# Patient Record
Sex: Female | Born: 1965 | Race: White | Hispanic: No | Marital: Married | Smoking: Never smoker
Health system: Southern US, Community
[De-identification: ages and names within clinical notes are randomized; demographics above are authoritative.]

## PROBLEM LIST (undated history)

## (undated) HISTORY — PX: CHOLECYSTECTOMY: SHX55

---

## 2014-06-21 ENCOUNTER — Encounter: Payer: Self-pay | Admitting: *Deleted

## 2014-06-21 ENCOUNTER — Emergency Department (INDEPENDENT_AMBULATORY_CARE_PROVIDER_SITE_OTHER)
Admission: EM | Admit: 2014-06-21 | Discharge: 2014-06-21 | Disposition: A | Discharge status: Home / Self Care | Payer: Self-pay | Source: Home / Self Care | Attending: Family Medicine | Admitting: Family Medicine

## 2014-06-21 ENCOUNTER — Emergency Department (INDEPENDENT_AMBULATORY_CARE_PROVIDER_SITE_OTHER): Payer: Worker's Compensation

## 2014-06-21 DIAGNOSIS — M25572 Pain in left ankle and joints of left foot: Secondary | ICD-10-CM

## 2014-06-21 DIAGNOSIS — S93402A Sprain of unspecified ligament of left ankle, initial encounter: Secondary | ICD-10-CM | POA: Diagnosis not present

## 2014-06-21 DIAGNOSIS — S99912A Unspecified injury of left ankle, initial encounter: Secondary | ICD-10-CM | POA: Diagnosis not present

## 2014-06-21 DIAGNOSIS — S0990XA Unspecified injury of head, initial encounter: Secondary | ICD-10-CM

## 2014-06-21 NOTE — ED Provider Notes (Signed)
CSN: [id number]     Arrival date & time 06/21/14  1124 History   First MD Initiated Contact with Patient 06/21/14 1157     Chief Complaint  Patient presents with  . Fall      HPI Comments: Approximately one hour ago while walking down stairs at work, patient suddenly and briefly lost consciousness and fell near the bottom of the stairs.  She landed in the stairwell, and her posterior head hit a side wall.  She gained consciousness immediately.  Since then she has had nausea without vomiting and a mild posterior headache, but no other neurologic symptoms.  She twisted her left ankle and has pain with weight bearing. She states that she has a past history of anemia, but no recent episodes of dizziness or light-headedness.  Patient is a 49 y.o. female presenting with ankle pain. The history is provided by the patient.  Ankle Pain Location:  Ankle Time since incident:  1 hour Injury: yes   Mechanism of injury: fall   Fall:    Fall occurred:  Down stairs   Impact surface:  Hard floor   Entrapped after fall: no   Ankle location:  L ankle Pain details:    Quality:  Aching   Radiates to:  Does not radiate   Severity:  Moderate   Onset quality:  Sudden   Duration:  1 hour   Timing:  Constant   Progression:  Unchanged Chronicity:  New Dislocation: no   Prior injury to area:  No Relieved by:  None tried Worsened by:  Bearing weight Ineffective treatments:  None tried Associated symptoms: decreased ROM, stiffness and swelling   Associated symptoms: no neck pain, no numbness and no tingling   Risk factors: obesity     History reviewed. No pertinent past medical history. History reviewed. No pertinent past surgical history. Family History  Problem Relation Age of Onset  . Alzheimer's disease Mother   . Cancer Father     lung  . Diabetes Father    History  Substance Use Topics  . Smoking status: Never Smoker   . Smokeless tobacco: Not on file  . Alcohol Use: No   OB History      No data available     Review of Systems  Gastrointestinal: Positive for nausea. Negative for vomiting.  Musculoskeletal: Positive for stiffness. Negative for neck pain.       Left ankle pain  Neurological: Positive for headaches. Negative for dizziness, speech difficulty, weakness, light-headedness and numbness.  All other systems reviewed and are negative.   Allergies  Review of patient's allergies indicates no known allergies.  Home Medications   Prior to Admission medications   Not on File   BP 123/87 mmHg  Pulse 72  Temp(Src) 98.1 F (36.7 C) (Oral)  Resp 16  Ht 5\' 5"  (1.651 m)  Wt 260 lb (117.935 kg)  BMI 43.27 kg/m2  SpO2 98%  LMP 06/21/2014 Physical Exam  Constitutional: She is oriented to person, place, and time. She appears well-developed and well-nourished. No distress.  Patient is obese (BMI 43.3)  HENT:  Head: Normocephalic and atraumatic.  Right Ear: External ear normal.  Left Ear: External ear normal.  Nose: Nose normal.  Mouth/Throat: Oropharynx is clear and moist.  Eyes: Conjunctivae and EOM are normal. Pupils are equal, round, and reactive to light.  Neck: Normal range of motion.  Cardiovascular: Normal heart sounds.   Pulmonary/Chest: Breath sounds normal.  Abdominal: There is no tenderness.  Musculoskeletal:  Left ankle: She exhibits decreased range of motion and swelling. She exhibits no ecchymosis, no deformity, no laceration and normal pulse. Tenderness. Lateral malleolus tenderness found. No medial malleolus, no AITFL, no CF ligament, no posterior TFL, no head of 5th metatarsal and no proximal fibula tenderness found. Achilles tendon normal.       Feet:  Neurological: She is alert and oriented to person, place, and time. She has normal reflexes. No cranial nerve deficit. She exhibits normal muscle tone. Coordination normal.  Skin: Skin is warm and dry.  Nursing note and vitals reviewed.   ED Course  Procedures  none   Lab  Review: POCT CBC:  WBC 7.3; LY 31.4; MO 4.0; GR 64.6; Hgb 13.9; Platelets 173   Imaging Review Dg Ankle Complete Left  06/21/2014   CLINICAL DATA:  Larey Seat down steps today. Lateral ankle pain, swelling.  EXAM: LEFT ANKLE COMPLETE - 3+ VIEW  COMPARISON:  None.  FINDINGS: Lateral soft tissue swelling. No underlying bony abnormality. No fracture, subluxation or dislocation. Joint spaces are maintained. Plantar calcaneal spur present.  IMPRESSION: No acute bony abnormality.   Electronically Signed   By: Charlett Nose M.D.   On: 06/21/2014 12:45     MDM   1. Head injury, initial encounter, secondary to brief loss of cosciousness and fall.  No evidence anemia today   2. Left ankle injury, initial encounter   3. Left ankle sprain, initial encounter    Dispensed crutches.  Ace wrap applied, and fitted with AirCast Stirrup splint. Discussed head injury precautions. Apply ice pack for 30 minutes every 1 to 2 hours today and tomorrow.  Elevate.  Use crutches for 3 to 5 days.  Wear Ace wrap until swelling decreases.  Wear brace for about 2 to 3 weeks.  Begin range of motion and stretching exercises in about 5 days as per instruction sheet. May take Ibuprofen , 4 tabs every 8 hours with food.  If increasing nausea, vomiting, headache, etc occur, proceed to the local emergency room.  Followup with [facility] in one week. Recommend that she follow-up with her PCP if she develops recurrent episodes of dizziness or loss of consciousness.  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  Must use crutches for one week.    Lattie Haw, MD 06/21/14 [phone]

## 2014-06-21 NOTE — ED Notes (Signed)
Lauren Blankenship reports that she was walking down the stairs at work when "everything went black for a split second" and she fell injury her LT ankle and hitting her head. Denies any blurry vision now. She does c/o some nausea.

## 2014-06-21 NOTE — Discharge Instructions (Signed)
Apply ice pack for 30 minutes every 1 to 2 hours today and tomorrow.  Elevate.  Use crutches for 3 to 5 days.  Wear Ace wrap until swelling decreases.  Wear brace for about 2 to 3 weeks.  Begin range of motion and stretching exercises in about 5 days as per instruction sheet. May take Ibuprofen , 4 tabs every 8 hours with food.  If increasing nausea, vomiting, headache, etc occur, proceed to the local emergency room.    Ankle Sprain An ankle sprain is an injury to the strong, fibrous tissues (ligaments) that hold the bones of your ankle joint together.  CAUSES An ankle sprain is usually caused by a fall or by twisting your ankle. Ankle sprains most commonly occur when you step on the outer edge of your foot, and your ankle turns inward. People who participate in sports are more prone to these types of injuries.  SYMPTOMS   Pain in your ankle. The pain may be present at rest or only when you are trying to stand or walk.  Swelling.  Bruising. Bruising may develop immediately or within 1 to 2 days after your injury.  Difficulty standing or walking, particularly when turning corners or changing directions. DIAGNOSIS  Your caregiver will ask you details about your injury and perform a physical exam of your ankle to determine if you have an ankle sprain. During the physical exam, your caregiver will press on and apply pressure to specific areas of your foot and ankle. Your caregiver will try to move your ankle in certain ways. An X-ray exam may be done to be sure a bone was not broken or a ligament did not separate from one of the bones in your ankle (avulsion fracture).  TREATMENT  Certain types of braces can help stabilize your ankle. Your caregiver can make a recommendation for this. Your caregiver may recommend the use of medicine for pain. If your sprain is severe, your caregiver may refer you to a surgeon who helps to restore function to parts of your skeletal system (orthopedist) or a  physical therapist. HOME CARE INSTRUCTIONS   Apply ice to your injury for 1-2 days or as directed by your caregiver. Applying ice helps to reduce inflammation and pain.  Put ice in a plastic bag.  Place a towel between your skin and the bag.  Leave the ice on for 15-20 minutes at a time, every 2 hours while you are awake.  Only take over-the-counter or prescription medicines for pain, discomfort, or fever as directed by your caregiver.  Elevate your injured ankle above the level of your heart as much as possible for 2-3 days.  If your caregiver recommends crutches, use them as instructed. Gradually put weight on the affected ankle. Continue to use crutches or a cane until you can walk without feeling pain in your ankle.  If you have a plaster splint, wear the splint as directed by your caregiver. Do not rest it on anything harder than a pillow for the first 24 hours. Do not put weight on it. Do not get it wet. You may take it off to take a shower or bath.  You may have been given an elastic bandage to wear around your ankle to provide support. If the elastic bandage is too tight (you have numbness or tingling in your foot or your foot becomes cold and blue), adjust the bandage to make it comfortable.  If you have an air splint, you may blow more air into  it or let air out to make it more comfortable. You may take your splint off at night and before taking a shower or bath. Wiggle your toes in the splint several times per day to decrease swelling. SEEK MEDICAL CARE IF:   You have rapidly increasing bruising or swelling.  Your toes feel extremely cold or you lose feeling in your foot.  Your pain is not relieved with medicine. SEEK IMMEDIATE MEDICAL CARE IF:  Your toes are numb or blue.  You have severe pain that is increasing. MAKE SURE YOU:   Understand these instructions.  Will watch your condition.  Will get help right away if you are not doing well or get worse. Document  Released: 05/26/2005 Document Revised: 02/18/2012 Document Reviewed: 06/07/2011 [facility] Patient Information 2015 [place], [place]. This information is not intended to replace advice given to you by your health care provider. Make sure you discuss any questions you have with your health care provider.   Head Injury You have received a head injury. It does not appear serious at this time. Headaches and vomiting are common following head injury. It should be easy to awaken from sleeping. Sometimes it is necessary for you to stay in the emergency department for a while for observation. Sometimes admission to the hospital may be needed. After injuries such as yours, most problems occur within the first 24 hours, but side effects may occur up to 7-10 days after the injury. It is important for you to carefully monitor your condition and contact your health care provider or seek immediate medical care if there is a change in your condition. WHAT ARE THE TYPES OF HEAD INJURIES? Head injuries can be as minor as a bump. Some head injuries can be more severe. More severe head injuries include:  A jarring injury to the brain (concussion).  A bruise of the brain (contusion). This mean there is bleeding in the brain that can cause swelling.  A cracked skull (skull fracture).  Bleeding in the brain that collects, clots, and forms a bump (hematoma). WHAT CAUSES A HEAD INJURY? A serious head injury is most likely to happen to someone who is in a car wreck and is not wearing a seat belt. Other causes of major head injuries include bicycle or motorcycle accidents, sports injuries, and falls. HOW ARE HEAD INJURIES DIAGNOSED? A complete history of the event leading to the injury and your current symptoms will be helpful in diagnosing head injuries. Many times, pictures of the brain, such as CT or MRI are needed to see the extent of the injury. Often, an overnight hospital stay is necessary for observation.  WHEN  SHOULD I SEEK IMMEDIATE MEDICAL CARE?  You should get help right away if:  You have confusion or drowsiness.  You feel sick to your stomach (nauseous) or have continued, forceful vomiting.  You have dizziness or unsteadiness that is getting worse.  You have severe, continued headaches not relieved by medicine. Only take over-the-counter or prescription medicines for pain, fever, or discomfort as directed by your health care provider.  You do not have normal function of the arms or legs or are unable to walk.  You notice changes in the black spots in the center of the colored part of your eye (pupil).  You have a clear or bloody fluid coming from your nose or ears.  You have a loss of vision. During the next 24 hours after the injury, you must stay with someone who can watch you for  the warning signs. This person should contact local emergency services (911 in the [place] if you have seizures, you become unconscious, or you are unable to wake up. HOW CAN I PREVENT A HEAD INJURY IN THE FUTURE? The most important factor for preventing major head injuries is avoiding motor vehicle accidents. To minimize the potential for damage to your head, it is crucial to wear seat belts while riding in motor vehicles. Wearing helmets while bike riding and playing collision sports (like football) is also helpful. Also, avoiding dangerous activities around the house will further help reduce your risk of head injury.  WHEN CAN I RETURN TO NORMAL ACTIVITIES AND ATHLETICS? You should be reevaluated by your health care provider before returning to these activities. If you have any of the following symptoms, you should not return to activities or contact sports until 1 week after the symptoms have stopped:  Persistent headache.  Dizziness or vertigo.  Poor attention and concentration.  Confusion.  Memory problems.  Nausea or vomiting.  Fatigue or tire easily.  Irritability.  Intolerant of bright  lights or loud noises.  Anxiety or depression.  Disturbed sleep. MAKE SURE YOU:   Understand these instructions.  Will watch your condition.  Will get help right away if you are not doing well or get worse. Document Released: 05/26/2005 Document Revised: 05/31/2013 Document Reviewed: 01/31/2013 [facility] Patient Information 2015 [place]. This information is not intended to replace advice given to you by your health care provider. Make sure you discuss any questions you have with your health care provider.

## 2014-06-22 LAB — POCT CBC W AUTO DIFF (K'VILLE URGENT CARE)

## 2014-06-28 ENCOUNTER — Other Ambulatory Visit: Payer: Self-pay | Admitting: Adult Health

## 2014-06-28 ENCOUNTER — Ambulatory Visit (INDEPENDENT_AMBULATORY_CARE_PROVIDER_SITE_OTHER): Payer: Self-pay

## 2014-06-28 DIAGNOSIS — R52 Pain, unspecified: Secondary | ICD-10-CM

## 2014-06-28 DIAGNOSIS — M25571 Pain in right ankle and joints of right foot: Secondary | ICD-10-CM

## 2015-08-09 DIAGNOSIS — R42 Dizziness and giddiness: Secondary | ICD-10-CM | POA: Diagnosis not present

## 2015-08-09 DIAGNOSIS — F411 Generalized anxiety disorder: Secondary | ICD-10-CM | POA: Diagnosis not present

## 2015-10-25 DIAGNOSIS — B9689 Other specified bacterial agents as the cause of diseases classified elsewhere: Secondary | ICD-10-CM | POA: Diagnosis not present

## 2015-10-25 DIAGNOSIS — J208 Acute bronchitis due to other specified organisms: Secondary | ICD-10-CM | POA: Diagnosis not present

## 2015-10-25 DIAGNOSIS — R509 Fever, unspecified: Secondary | ICD-10-CM | POA: Diagnosis not present

## 2015-12-12 DIAGNOSIS — K8050 Calculus of bile duct without cholangitis or cholecystitis without obstruction: Secondary | ICD-10-CM | POA: Diagnosis not present

## 2015-12-12 DIAGNOSIS — K760 Fatty (change of) liver, not elsewhere classified: Secondary | ICD-10-CM | POA: Diagnosis not present

## 2015-12-12 DIAGNOSIS — R8299 Other abnormal findings in urine: Secondary | ICD-10-CM | POA: Diagnosis not present

## 2015-12-12 DIAGNOSIS — R161 Splenomegaly, not elsewhere classified: Secondary | ICD-10-CM | POA: Diagnosis not present

## 2015-12-12 DIAGNOSIS — R109 Unspecified abdominal pain: Secondary | ICD-10-CM | POA: Diagnosis not present

## 2015-12-12 DIAGNOSIS — R197 Diarrhea, unspecified: Secondary | ICD-10-CM | POA: Diagnosis not present

## 2016-02-28 ENCOUNTER — Encounter: Payer: Self-pay | Admitting: Emergency Medicine

## 2016-02-28 ENCOUNTER — Emergency Department (INDEPENDENT_AMBULATORY_CARE_PROVIDER_SITE_OTHER)
Admission: EM | Admit: 2016-02-28 | Discharge: 2016-02-28 | Disposition: A | Discharge status: Home / Self Care | Payer: 59 | Source: Home / Self Care | Attending: Family Medicine | Admitting: Family Medicine

## 2016-02-28 ENCOUNTER — Emergency Department (INDEPENDENT_AMBULATORY_CARE_PROVIDER_SITE_OTHER): Payer: 59

## 2016-02-28 DIAGNOSIS — M238X2 Other internal derangements of left knee: Secondary | ICD-10-CM

## 2016-02-28 DIAGNOSIS — M25562 Pain in left knee: Secondary | ICD-10-CM | POA: Diagnosis not present

## 2016-02-28 NOTE — Discharge Instructions (Signed)
Wear knee brace daytime.  Apply ice pack for 20 to 30 minutes, 3 to 4 times daily  Continue until pain decreases. May take Ibuprofen 200mg , 4 tabs every 8 hours with food.  Begin knee strengthening exercises.

## 2016-02-28 NOTE — ED Triage Notes (Signed)
Patient report left knee pain without known injury or motion for past 6 weeks; has intermittent pain and 'locking'. Took ibuprofen today at 0800.

## 2016-02-28 NOTE — ED Provider Notes (Signed)
[facility]    CSN: [id number] Arrival date & time: 02/28/16  1049  First Provider Contact:  First MD Initiated Contact with Patient 02/28/16 1118        History   Chief Complaint Chief Complaint  Patient presents with  . Knee Pain    HPI Lauren PancoastMichelle H Sween is a 50 y.o. female.   Patient complains of 6 week history of persistent pain in her left knee without known injury.  The knee frequently gives way, and she has difficulty extending the knee.   She has pain when walking/standing.     The history is provided by the patient.  Knee Pain  Location:  Knee Time since incident:  6 weeks Injury: no   Knee location:  L knee Pain details:    Quality:  Aching   Radiates to:  Does not radiate   Severity:  Moderate   Onset quality:  Gradual   Duration:  6 weeks   Timing:  Constant   Progression:  Unchanged Chronicity:  New Dislocation: no   Prior injury to area:  No Relieved by:  Nothing Worsened by:  Bearing weight, extension and activity Ineffective treatments:  Ice Associated symptoms: decreased ROM and stiffness   Associated symptoms: no back pain, no muscle weakness, no numbness, no swelling and no tingling   Risk factors: obesity     History reviewed. No pertinent past medical history.  There are no active problems to display for this patient.   Past Surgical History:  Procedure Laterality Date  . CHOLECYSTECTOMY      OB History    No data available       Home Medications    Prior to Admission medications   Not on File    Family History Family History  Problem Relation Age of Onset  . Cancer Father     lung  . Diabetes Father   . Alzheimer's disease Mother     Social History Social History  Substance Use Topics  . Smoking status: Never Smoker  . Smokeless tobacco: Never Used  . Alcohol use No     Allergies   Review of patient's allergies indicates no known allergies.   Review of Systems Review of Systems    Musculoskeletal: Positive for stiffness. Negative for back pain.  All other systems reviewed and are negative.    Physical Exam Triage Vital Signs ED Triage Vitals  Enc Vitals Group     BP 02/28/16 1116 132/79     Pulse Rate 02/28/16 1116 75     Resp 02/28/16 1116 16     Temp 02/28/16 1116 97.9 F (36.6 C)     Temp Source 02/28/16 1116 Oral     SpO2 02/28/16 1116 96 %     Weight 02/28/16 1117 260 lb (117.9 kg)     Height 02/28/16 1117 5\' 5"  (1.651 m)     Head Circumference --      Peak Flow --      Pain Score 02/28/16 1118 4     Pain Loc --      Pain Edu? --      Excl. in GC? --    No data found.   Updated Vital Signs BP 132/79 (BP Location: Left Arm)   Pulse 75   Temp 97.9 F (36.6 C) (Oral)   Resp 16   Ht 5\' 5"  (1.651 m)   Wt 260 lb (117.9 kg)   LMP 02/13/2016 (Exact Date)   SpO2 96%  BMI 43.27 kg/m   Visual Acuity Right Eye Distance:   Left Eye Distance:   Bilateral Distance:    Right Eye Near:   Left Eye Near:    Bilateral Near:     Physical Exam  Constitutional: She appears well-developed and well-nourished. No distress.  HENT:  Head: Atraumatic.  Nose: Nose normal.  Mouth/Throat: Oropharynx is clear and moist.  Eyes: Conjunctivae are normal. Pupils are equal, round, and reactive to light.  Neck: Normal range of motion.  Cardiovascular: Normal heart sounds.   Pulmonary/Chest: Breath sounds normal.  Musculoskeletal:       Left knee: She exhibits decreased range of motion. She exhibits no swelling, no ecchymosis, no deformity, no laceration, no erythema, normal alignment, no LCL laxity and normal patellar mobility. No tenderness found.   Left knee:  No effusion, erythema, or warmth.  Knee stable, negative drawer test.  McMurray test negative.  Patient has mild pain with full extension.  No distinct areas of tenderness.   Neurological: She is alert.  Skin: Skin is warm and dry. No rash noted.  Nursing note and vitals reviewed.    UC Treatments  / Results  Labs (all labs ordered are listed, but only abnormal results are displayed) Labs Reviewed - No data to display  EKG  EKG Interpretation None       Radiology Dg Knee Complete 4 Views Left  Result Date: 02/28/2016 CLINICAL DATA:  Left knee pain for 6 weeks. EXAM: LEFT KNEE - COMPLETE 4+ VIEW COMPARISON:  None. FINDINGS: No evidence of fracture, dislocation, or joint effusion. No evidence of arthropathy or other focal bone abnormality. Soft tissues are unremarkable. IMPRESSION: Negative. Electronically Signed   By: Marnee Spring M.D.   On: 02/28/2016 11:49    Procedures Procedures (including critical care time)  Medications Ordered in UC Medications - No data to display   Initial Impression / Assessment and Plan / UC Course  I have reviewed the triage vital signs and the nursing notes.  Pertinent labs & imaging results that were available during my care of the patient were reviewed by me and considered in my medical decision making (see chart for details).  Clinical Course  Dispensed hinged knee brace. Wear knee brace daytime.  Apply ice pack for 20 to 30 minutes, 3 to 4 times daily  Continue until pain decreases. May take Ibuprofen 200mg , 4 tabs every 8 hours with food.  Begin knee strengthening exercises. Followup with Dr. Rodney Langton or Dr. Clementeen Graham ([facility]) if not improving about two weeks.      Final Clinical Impressions(s) / UC Diagnoses   Final diagnoses:  Knee joint laxity, left    New Prescriptions New Prescriptions   No medications on file     Lattie Haw, MD 03/11/16 1126

## 2016-02-29 ENCOUNTER — Ambulatory Visit: Payer: Self-pay

## 2016-05-13 DIAGNOSIS — R8299 Other abnormal findings in urine: Secondary | ICD-10-CM | POA: Diagnosis not present

## 2016-05-13 DIAGNOSIS — N938 Other specified abnormal uterine and vaginal bleeding: Secondary | ICD-10-CM | POA: Diagnosis not present

## 2016-05-13 DIAGNOSIS — E782 Mixed hyperlipidemia: Secondary | ICD-10-CM | POA: Diagnosis not present

## 2016-05-13 DIAGNOSIS — Z0000 Encounter for general adult medical examination without abnormal findings: Secondary | ICD-10-CM | POA: Diagnosis not present

## 2016-05-13 DIAGNOSIS — E559 Vitamin D deficiency, unspecified: Secondary | ICD-10-CM | POA: Diagnosis not present

## 2016-05-24 DIAGNOSIS — E782 Mixed hyperlipidemia: Secondary | ICD-10-CM | POA: Diagnosis not present

## 2016-06-06 DIAGNOSIS — N938 Other specified abnormal uterine and vaginal bleeding: Secondary | ICD-10-CM | POA: Diagnosis not present

## 2016-06-18 DIAGNOSIS — Z1231 Encounter for screening mammogram for malignant neoplasm of breast: Secondary | ICD-10-CM | POA: Diagnosis not present

## 2016-06-30 DIAGNOSIS — N840 Polyp of corpus uteri: Secondary | ICD-10-CM | POA: Diagnosis not present

## 2016-06-30 DIAGNOSIS — N939 Abnormal uterine and vaginal bleeding, unspecified: Secondary | ICD-10-CM | POA: Diagnosis not present

## 2016-09-19 DIAGNOSIS — J329 Chronic sinusitis, unspecified: Secondary | ICD-10-CM | POA: Diagnosis not present

## 2016-09-19 DIAGNOSIS — J40 Bronchitis, not specified as acute or chronic: Secondary | ICD-10-CM | POA: Diagnosis not present

## 2016-09-24 DIAGNOSIS — F411 Generalized anxiety disorder: Secondary | ICD-10-CM | POA: Diagnosis not present

## 2016-09-24 DIAGNOSIS — F339 Major depressive disorder, recurrent, unspecified: Secondary | ICD-10-CM | POA: Diagnosis not present

## 2016-10-16 DIAGNOSIS — F411 Generalized anxiety disorder: Secondary | ICD-10-CM | POA: Diagnosis not present

## 2016-10-29 DIAGNOSIS — F410 Panic disorder [episodic paroxysmal anxiety] without agoraphobia: Secondary | ICD-10-CM | POA: Diagnosis not present

## 2016-10-29 DIAGNOSIS — F341 Dysthymic disorder: Secondary | ICD-10-CM | POA: Diagnosis not present

## 2016-11-05 DIAGNOSIS — F410 Panic disorder [episodic paroxysmal anxiety] without agoraphobia: Secondary | ICD-10-CM | POA: Diagnosis not present

## 2016-11-05 DIAGNOSIS — F341 Dysthymic disorder: Secondary | ICD-10-CM | POA: Diagnosis not present

## 2016-11-18 DIAGNOSIS — H40013 Open angle with borderline findings, low risk, bilateral: Secondary | ICD-10-CM | POA: Diagnosis not present

## 2016-11-19 DIAGNOSIS — F341 Dysthymic disorder: Secondary | ICD-10-CM | POA: Diagnosis not present

## 2016-11-19 DIAGNOSIS — F410 Panic disorder [episodic paroxysmal anxiety] without agoraphobia: Secondary | ICD-10-CM | POA: Diagnosis not present

## 2017-12-05 IMAGING — DX DG KNEE COMPLETE 4+V*L*
4 series · 4 of 4 positions shown · non-contrast
Comparison: None.

CLINICAL DATA: Left knee pain for 6 weeks.

EXAM:
LEFT KNEE - COMPLETE 4+ VIEW

[knee ap]
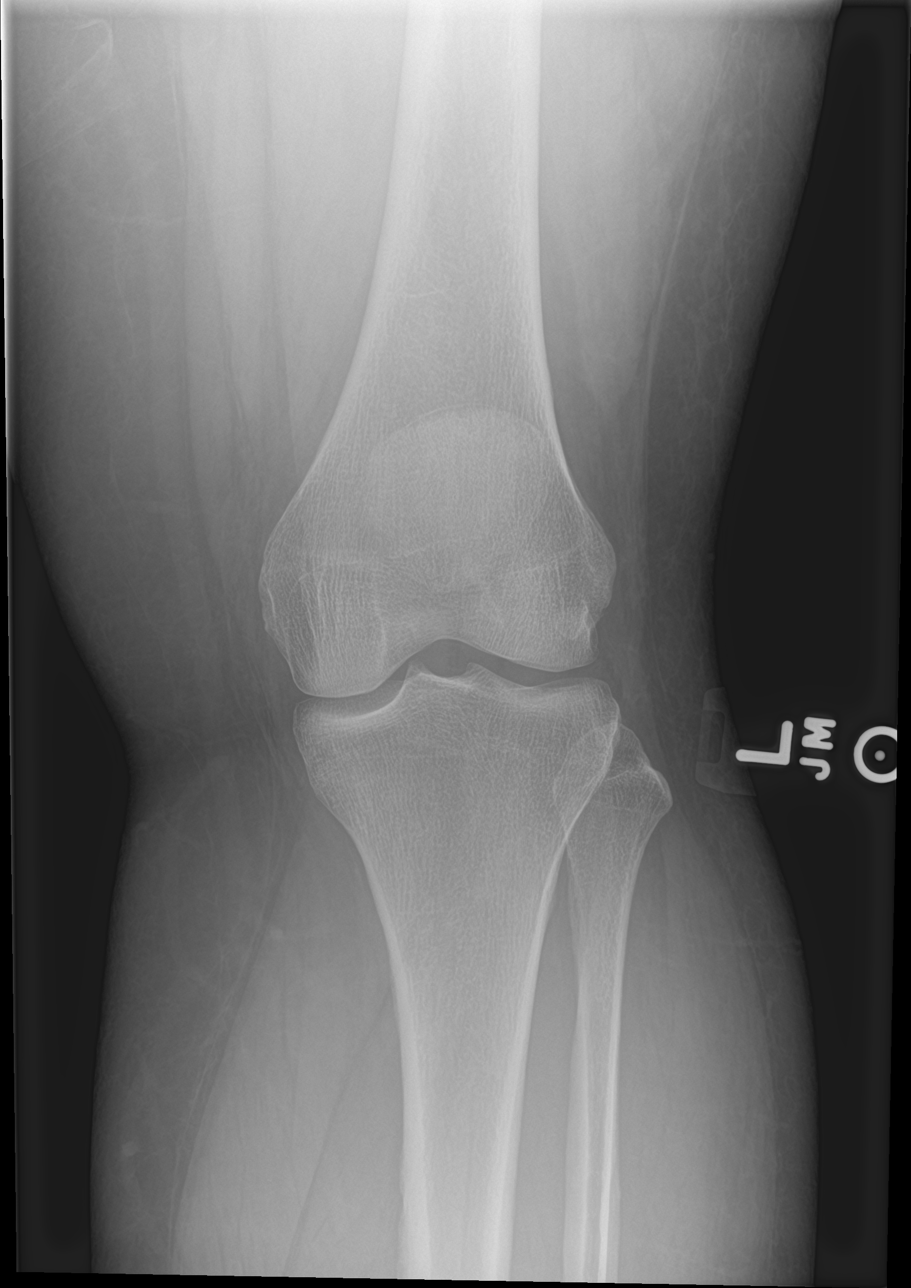

[knee lat]
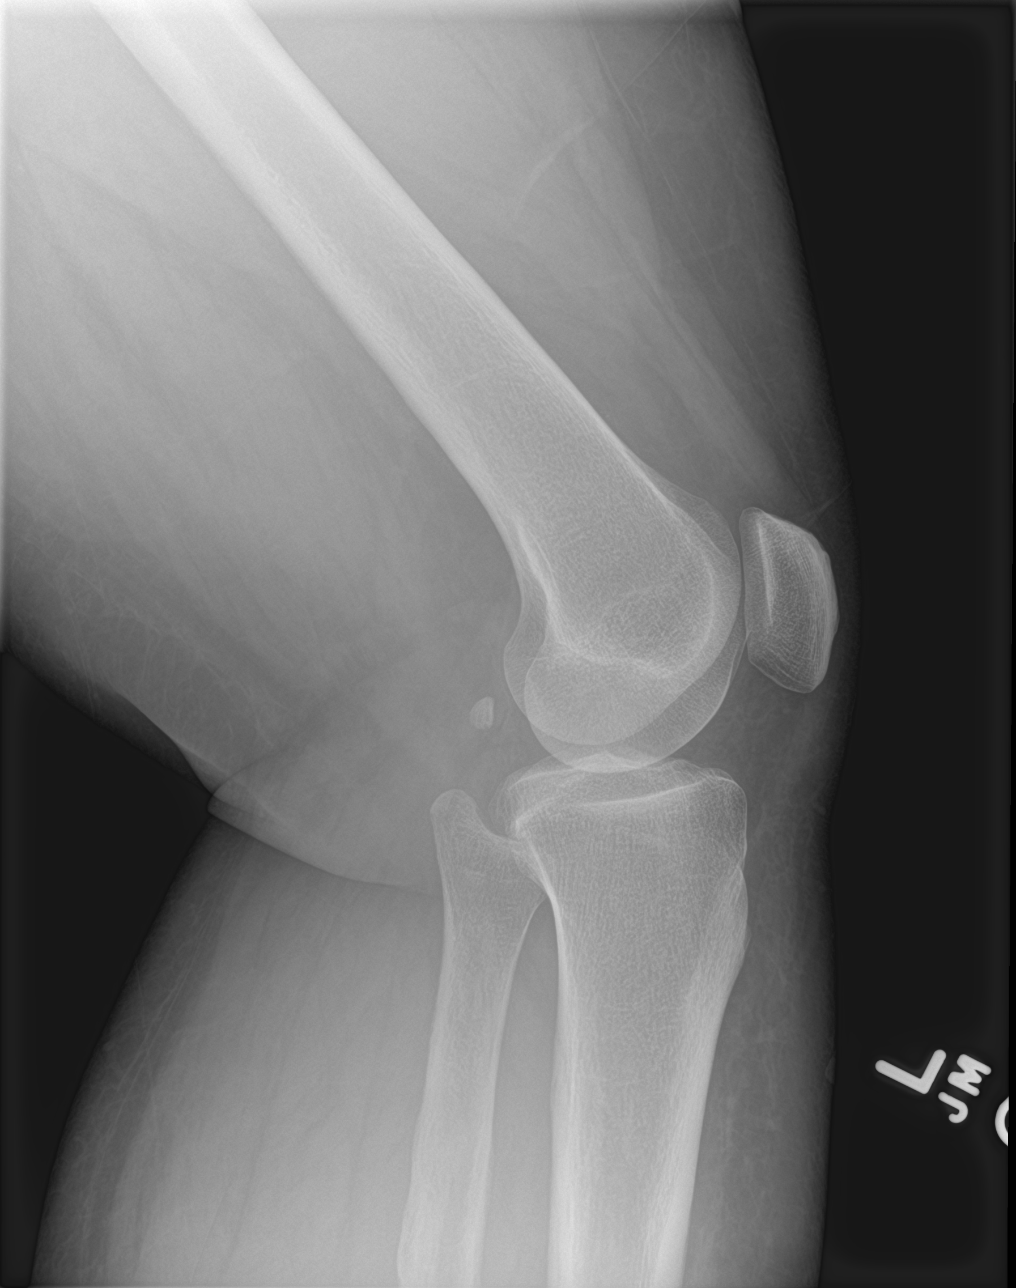

[knee obl (1 of 2)]
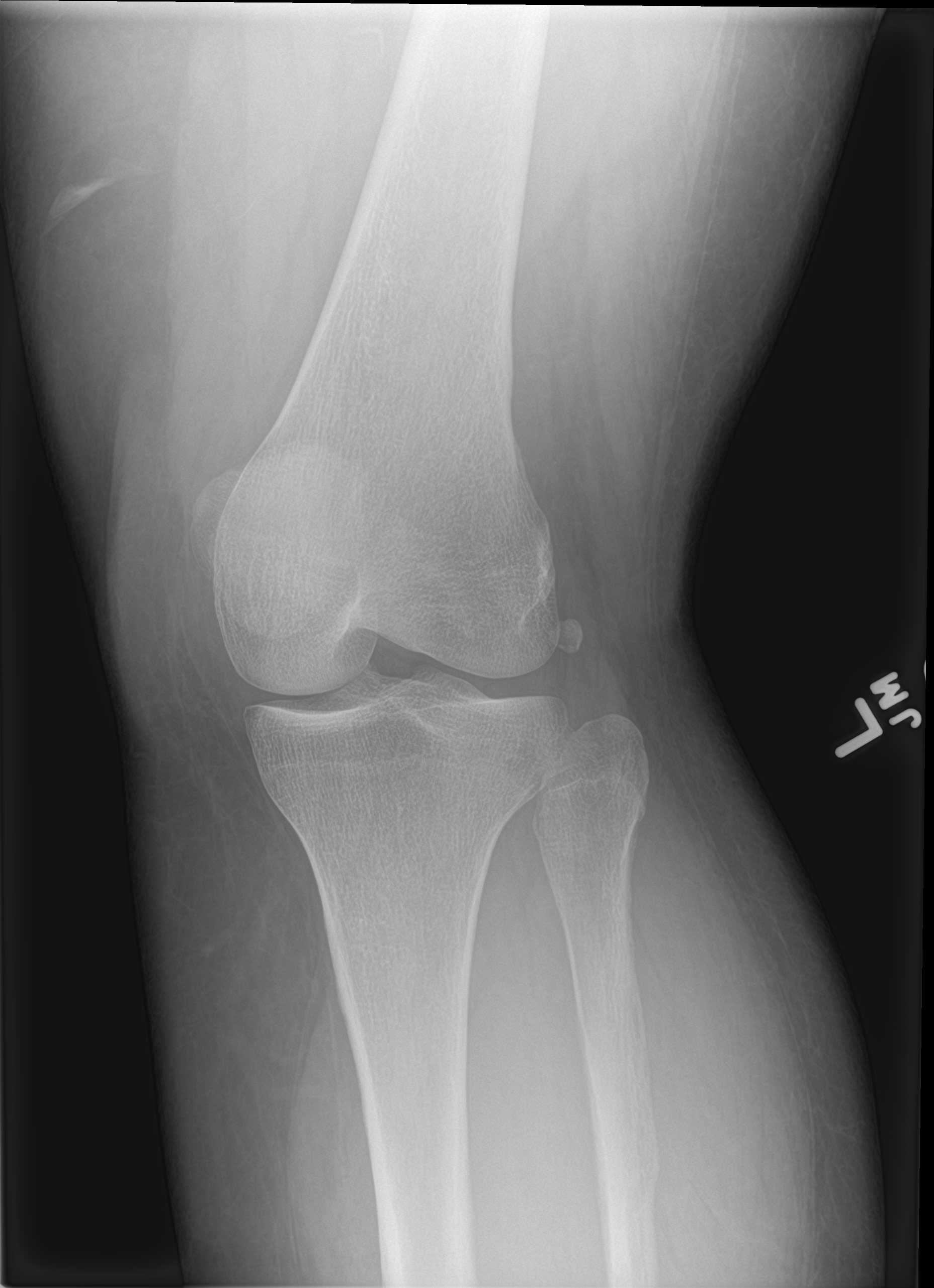

[knee obl (2 of 2)]
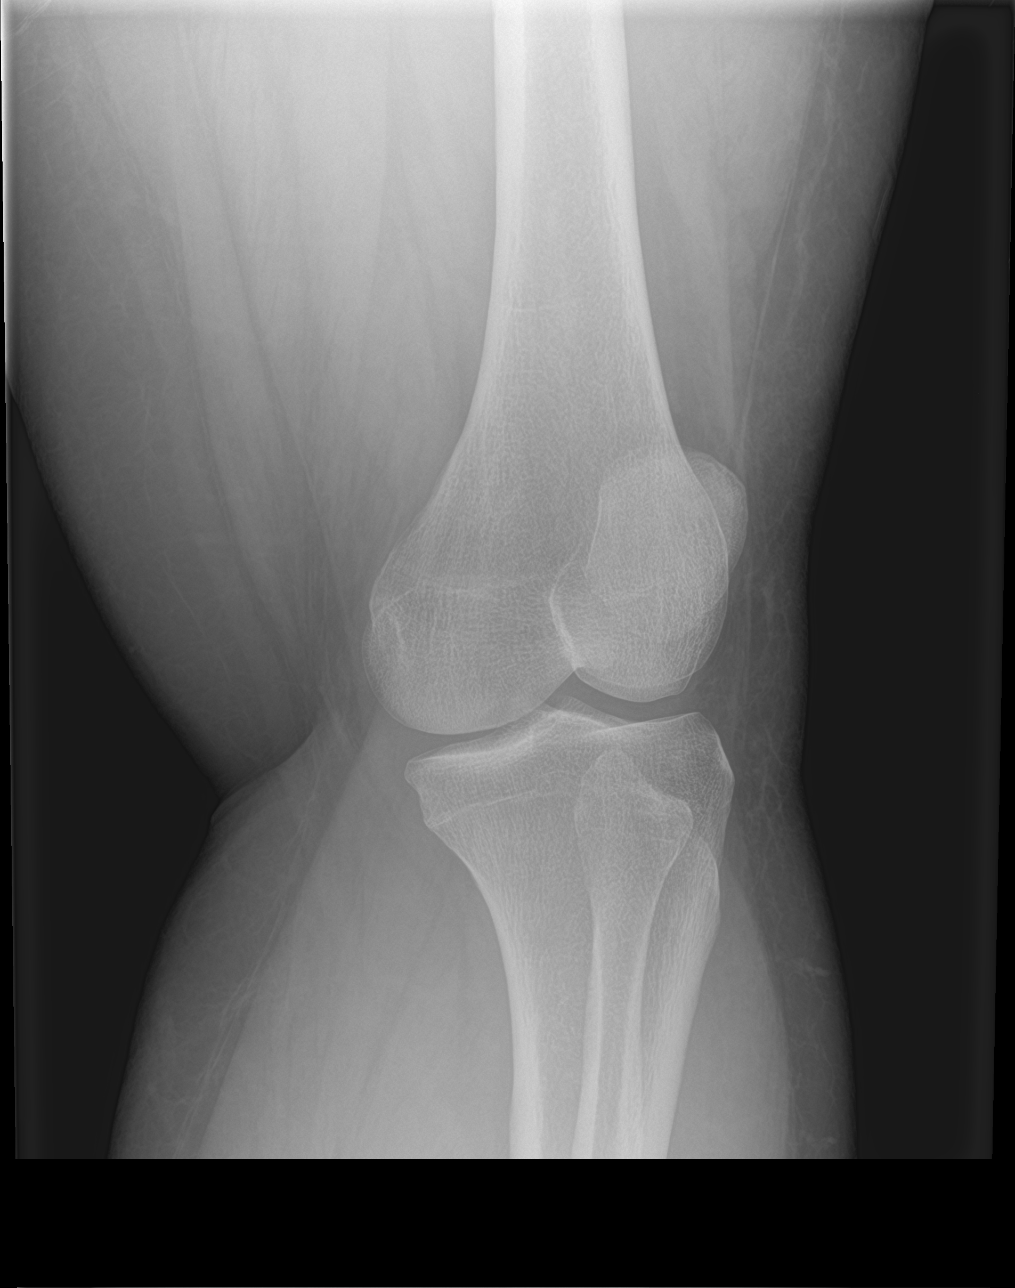

[4 of 4 positions shown; findings below may reference images not displayed]

FINDINGS: No evidence of fracture, dislocation, or joint effusion. No evidence
of arthropathy or other focal bone abnormality. Soft tissues are
unremarkable.
IMPRESSION: Negative.
